# Patient Record
Sex: Male | Born: 1955 | Race: White | Hispanic: No | Marital: Married | Smoking: Former smoker
Health system: Southern US, Community
[De-identification: ages and names within clinical notes are randomized; demographics above are authoritative.]

## PROBLEM LIST (undated history)

## (undated) DIAGNOSIS — T7840XA Allergy, unspecified, initial encounter: Secondary | ICD-10-CM

## (undated) DIAGNOSIS — K219 Gastro-esophageal reflux disease without esophagitis: Secondary | ICD-10-CM

## (undated) HISTORY — DX: Allergy, unspecified, initial encounter: T78.40XA

## (undated) HISTORY — DX: Gastro-esophageal reflux disease without esophagitis: K21.9

## (undated) HISTORY — PX: COLONOSCOPY: SHX174

---

## 2008-02-21 ENCOUNTER — Emergency Department (HOSPITAL_COMMUNITY): Admission: EM | Admit: 2008-02-21 | Discharge: 2008-02-21 | Payer: Self-pay | Admitting: Emergency Medicine

## 2009-03-07 ENCOUNTER — Encounter: Payer: Self-pay | Admitting: Internal Medicine

## 2009-03-29 ENCOUNTER — Ambulatory Visit: Payer: Self-pay | Admitting: Internal Medicine

## 2009-03-29 DIAGNOSIS — R05 Cough: Secondary | ICD-10-CM | POA: Insufficient documentation

## 2009-03-29 DIAGNOSIS — R053 Chronic cough: Secondary | ICD-10-CM | POA: Insufficient documentation

## 2009-03-29 DIAGNOSIS — R930 Abnormal findings on diagnostic imaging of skull and head, not elsewhere classified: Secondary | ICD-10-CM | POA: Insufficient documentation

## 2009-04-27 ENCOUNTER — Ambulatory Visit: Payer: Self-pay | Admitting: Internal Medicine

## 2009-05-15 ENCOUNTER — Telehealth: Payer: Self-pay | Admitting: Internal Medicine

## 2009-05-21 ENCOUNTER — Ambulatory Visit: Payer: Self-pay | Admitting: Internal Medicine

## 2012-12-13 ENCOUNTER — Encounter (INDEPENDENT_AMBULATORY_CARE_PROVIDER_SITE_OTHER): Payer: Self-pay

## 2012-12-13 ENCOUNTER — Encounter (INDEPENDENT_AMBULATORY_CARE_PROVIDER_SITE_OTHER): Payer: Self-pay | Admitting: *Deleted

## 2016-10-20 ENCOUNTER — Encounter: Payer: Self-pay | Admitting: Podiatry

## 2016-10-20 ENCOUNTER — Ambulatory Visit (INDEPENDENT_AMBULATORY_CARE_PROVIDER_SITE_OTHER): Payer: BLUE CROSS/BLUE SHIELD

## 2016-10-20 ENCOUNTER — Ambulatory Visit (INDEPENDENT_AMBULATORY_CARE_PROVIDER_SITE_OTHER): Payer: BLUE CROSS/BLUE SHIELD | Admitting: Podiatry

## 2016-10-20 VITALS — BP 116/65 | HR 60 | Resp 16 | Ht 71.0 in | Wt 198.0 lb

## 2016-10-20 DIAGNOSIS — L84 Corns and callosities: Secondary | ICD-10-CM

## 2016-10-20 DIAGNOSIS — D169 Benign neoplasm of bone and articular cartilage, unspecified: Secondary | ICD-10-CM

## 2016-10-20 NOTE — Progress Notes (Signed)
   Subjective:    Patient ID: David Hopkins, male    DOB: Dec 05, 1955, 61 y.o.   MRN: [id number]  HPI Chief Complaint  Patient presents with  . Callouses    Left foot; 5th toe-medial side; x1+ yr      Review of Systems  HENT: Positive for tinnitus.   All other systems reviewed and are negative.      Objective:   Physical Exam        Assessment & Plan:

## 2016-10-20 NOTE — Progress Notes (Signed)
Subjective:     Patient ID: David Hopkins, male   DOB: October 02, 1955, 61 y.o.   MRN: [id number]  HPI patient presents with wife with painful lesion on the inside of the fifth toe left that he states is very tender and hurts and he's tried to pad it and trim it and it's been going on for over a year   Review of Systems  All other systems reviewed and are negative.      Objective:   Physical Exam  Constitutional: He is oriented to person, place, and time.  Cardiovascular: Intact distal pulses.   Musculoskeletal: Normal range of motion.  Neurological: He is oriented to person, place, and time.  Skin: Skin is warm.  Nursing note and vitals reviewed.  neurovascular status intact muscle strength found to be within normal limits with patient found to have distal medial keratotic lesion digit 5 left that's very painful when pressed and makes wearing shoe gear difficult. 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 I debrided today and padded and scheduled him for exostectomy and I allowed him to read a consent form reviewing alternative treatments complications. Patient wants surgery signed consent form understanding risk and is scheduled for office surgery to remove the spur fifth digit left foot  X-ray indicates rotation fifth digit left with osteochondral lesion formation distal medial aspect fifth digit left

## 2016-10-27 ENCOUNTER — Encounter: Payer: Self-pay | Admitting: Podiatry

## 2016-10-27 ENCOUNTER — Ambulatory Visit (INDEPENDENT_AMBULATORY_CARE_PROVIDER_SITE_OTHER): Payer: BLUE CROSS/BLUE SHIELD | Admitting: Podiatry

## 2016-10-27 VITALS — BP 119/66 | HR 63 | Temp 96.3°F | Resp 16

## 2016-10-27 DIAGNOSIS — D169 Benign neoplasm of bone and articular cartilage, unspecified: Secondary | ICD-10-CM | POA: Diagnosis not present

## 2016-10-27 NOTE — Progress Notes (Signed)
Subjective:     Patient ID: David Hopkins, male   DOB: [birth year], 61 y.o.   MRN: [id number]  HPI patient presents stating he is ready to get this spur on his toe fixed   Review of Systems     Objective:   Physical Exam Neurovascular status intact negative Homans sign was noted with patient's fifth digit left showing keratotic lesion distal medial aspect of the toe with quite a bit of pain present    Assessment:     Exostosis fifth digit left with pain    Plan:     Patient was anesthetized with 100 mg Xylocaine Marcaine mixture and the patient's left foot was prepped and draped utilizing standard aseptic technique. The left foot was exsanguinated and the tourniquet was inflated to 250 mmHg and the following procedure was performed. Attention was directed to the distal medial aspect of the fifth digit left where a some elliptical incision was made over the offending lesion. The incision was deepened down to bone in the intervening skin wedge was noted toe toe. Utilizing a power bur the exostosis was resected flush with all surfaces and a bone paste was created which was excised. The wound was flushed with copious posterior margin solution and sutured with 5-0 nylon and sterile dressing applied with tourniquet released and Refill noted to be immediate to all digits. The patient was taken out of the OR in satisfactory condition

## 2016-10-30 ENCOUNTER — Telehealth: Payer: Self-pay | Admitting: *Deleted

## 2016-10-30 NOTE — Telephone Encounter (Addendum)
Pt's wife, Curly [place] states she is calling to be reminded of when pt can take his dressing off his toe after the surgical procedure Monday. Left message to call again for instructions. Pt states he and his wife had stepped out and missed my call. I informed pt Dr.Regal had stated that he could remove the surgical dressing and get the area shower wet, no standing water, pat area dry and cover with a dry bandaid. Pt states understanding,

## 2016-11-10 ENCOUNTER — Ambulatory Visit (INDEPENDENT_AMBULATORY_CARE_PROVIDER_SITE_OTHER): Payer: Self-pay | Admitting: Podiatry

## 2016-11-10 ENCOUNTER — Ambulatory Visit (INDEPENDENT_AMBULATORY_CARE_PROVIDER_SITE_OTHER): Payer: BLUE CROSS/BLUE SHIELD

## 2016-11-10 ENCOUNTER — Encounter: Payer: Self-pay | Admitting: Podiatry

## 2016-11-10 VITALS — Temp 97.1°F

## 2016-11-10 DIAGNOSIS — Z9889 Other specified postprocedural states: Secondary | ICD-10-CM | POA: Diagnosis not present

## 2016-11-10 DIAGNOSIS — D169 Benign neoplasm of bone and articular cartilage, unspecified: Secondary | ICD-10-CM

## 2016-11-10 NOTE — Progress Notes (Signed)
Subjective:    Patient ID: David Hopkins, male   DOB: 61 y.o.   MRN: [id number]   HPI patient states she's doing very well with his surgery    ROS      Objective:  Physical Exam Neurovascular status intact with significant diminishment of discomfort left fifth digit with wound edges well coapted stitches intact    Assessment:     Doing well post exostectomy fifth digit left    Plan:     Stitches removed wound edges coapted well applied sterile dressing advised on wider shoes. Reappoint to recheck and reviewed x-rays  X-rays reveal satisfactory section of bone

## 2016-12-12 NOTE — Progress Notes (Signed)
Office Surgery DOS 04.16.2018 Removal bone spur 5th toe left.

## 2019-01-18 ENCOUNTER — Encounter: Payer: Self-pay | Admitting: Gastroenterology

## 2019-02-09 ENCOUNTER — Ambulatory Visit (AMBULATORY_SURGERY_CENTER): Payer: Self-pay

## 2019-02-09 ENCOUNTER — Other Ambulatory Visit: Payer: Self-pay

## 2019-02-09 VITALS — Ht 71.0 in | Wt 186.0 lb

## 2019-02-09 DIAGNOSIS — Z1211 Encounter for screening for malignant neoplasm of colon: Secondary | ICD-10-CM

## 2019-02-09 MED ORDER — SUPREP BOWEL PREP KIT 17.5-3.13-1.6 GM/177ML PO SOLN: 1.0000 | Freq: Once | ORAL | 0 refills | Status: AC

## 2019-02-09 NOTE — Progress Notes (Signed)
Per pt, no allergies to soy or egg products.Pt not taking any weight loss meds or using  O2 at home. Pt denies sedation problems  Pt refused emmi video.  The PV was done over the phone due to COVID-19. Verified pt's address and insurance. Reviewed prep instructions and medical hx and will mail paperwork to pt. He will call with any questions or changes prior to his procedure.

## 2019-02-17 ENCOUNTER — Telehealth: Payer: Self-pay | Admitting: Gastroenterology

## 2019-02-17 NOTE — Telephone Encounter (Signed)
Colon WED 8-12  Wife is calling for the pt-   Pt has NOT been exposed to the COVID virus per the pt's wife- he mowed grass last week and now has congestion and this is due to his allergies- he has no fever- he has some cough but he " coughs all the time " due to his allergies per the wife but no change in the cough - He has  Chronic Rhinitis per his allergist -  This is not anything new for him, she asked if he could take the Mucinex DM  Instructed Wife that if he has any NEW signs/symptoms like fever, change in cough, loss of taste or smell, shortness of breath, they will need to call back-  If this is just the same allergy issues he is good to have the colon WED - ok as well to take the Mucinex as directed by allergist   Wife states she will call back if any changes   Lelan Pons PV

## 2019-02-17 NOTE — Telephone Encounter (Signed)
Pt reported that he has allergies and drainage and is taking Mucinex DM.  He would like to know whether this would interfere with colonoscopy.

## 2019-02-22 ENCOUNTER — Telehealth: Payer: Self-pay | Admitting: Gastroenterology

## 2019-02-22 NOTE — Telephone Encounter (Signed)

## 2019-02-23 ENCOUNTER — Other Ambulatory Visit: Payer: Self-pay

## 2019-02-23 ENCOUNTER — Ambulatory Visit (AMBULATORY_SURGERY_CENTER): Payer: BC Managed Care – PPO | Admitting: Gastroenterology

## 2019-02-23 ENCOUNTER — Encounter: Payer: Self-pay | Admitting: Gastroenterology

## 2019-02-23 VITALS — BP 102/61 | HR 62 | Temp 98.4°F | Resp 11 | Ht 71.0 in | Wt 186.0 lb

## 2019-02-23 DIAGNOSIS — Z1211 Encounter for screening for malignant neoplasm of colon: Secondary | ICD-10-CM | POA: Diagnosis present

## 2019-02-23 DIAGNOSIS — D123 Benign neoplasm of transverse colon: Secondary | ICD-10-CM

## 2019-02-23 MED ORDER — SODIUM CHLORIDE 0.9 % IV SOLN: 500.0000 mL | Freq: Once | INTRAVENOUS | Status: DC

## 2019-02-23 NOTE — Progress Notes (Signed)
Report given to PACU, vss 

## 2019-02-23 NOTE — Progress Notes (Signed)
Pt's states no medical or surgical changes since previsit or office visit.  JB temps and CW vitals. 

## 2019-02-23 NOTE — Op Note (Signed)
[facility] Patient Name: David Hopkins Procedure Date: 02/23/2019 8:19 AM MRN: [id number] Endoscopist: Mallie Mussel L. Loletha Carrow , MD Age: 63 Referring MD:  Date of Birth: 15-Oct-1955 Gender: Male Account #: [id number] Procedure:                Colonoscopy Indications:              Screening for colorectal malignant neoplasm                            (patient reported normal colonoscopy > 10 years                            prior) Medicines:                Monitored Anesthesia Care Procedure:                Pre-Anesthesia Assessment:                           - Prior to the procedure, a History and Physical                            was performed, and patient medications and                            allergies were reviewed. The patient's tolerance of                            previous anesthesia was also reviewed. The risks                            and benefits of the procedure and the sedation                            options and risks were discussed with the patient.                            All questions were answered, and informed consent                            was obtained. Prior Anticoagulants: The patient has                            taken no previous anticoagulant or antiplatelet                            agents. ASA Grade Assessment: II - A patient with                            mild systemic disease. After reviewing the risks                            and benefits, the patient was deemed in  satisfactory condition to undergo the procedure.                           After obtaining informed consent, the colonoscope                            was passed under direct vision. Throughout the                            procedure, the patient's blood pressure, pulse, and                            oxygen saturations were monitored continuously. The                            Colonoscope was introduced through the anus and              advanced to the the cecum, identified by                            appendiceal orifice and ileocecal valve. The                            colonoscopy was performed without difficulty. The                            patient tolerated the procedure well. The quality                            of the bowel preparation was good. The ileocecal                            valve, appendiceal orifice, and rectum were                            photographed. Scope In: 8:39:26 AM Scope Out: 8:51:33 AM Scope Withdrawal Time: 0 hours 9 minutes 51 seconds  Total Procedure Duration: 0 hours 12 minutes 7 seconds  Findings:                 The perianal and digital rectal examinations were                            normal.                           Multiple diverticula were found in the left colon                            and right colon.                           A 8 mm polyp was found in the transverse colon. The                            polyp was sessile.  The polyp was removed with a                            cold snare. Resection and retrieval were complete.                           The exam was otherwise without abnormality on                            direct and retroflexion views. Complications:            No immediate complications. Estimated Blood Loss:     Estimated blood loss was minimal. Impression:               - Diverticulosis in the left colon and in the right                            colon.                           - One 8 mm polyp in the transverse colon, removed                            with a cold snare. Resected and retrieved.                           - The examination was otherwise normal on direct                            and retroflexion views. Recommendation:           - Patient has a contact number available for                            emergencies. The signs and symptoms of potential                            delayed complications were discussed  with the                            patient. Return to normal activities tomorrow.                            Written discharge instructions were provided to the                            patient.                           - Resume previous diet.                           - Continue present medications.                           - Await pathology results.                           -  Repeat colonoscopy is recommended for                            surveillance. The colonoscopy date will be                            determined after pathology results from today's                            exam become available for review. Thereasa Iannello L. Loletha Carrow, MD 02/23/2019 8:57:24 AM This report has been signed electronically.

## 2019-02-23 NOTE — Progress Notes (Signed)
Called to room to assist during endoscopic procedure.  Patient ID and intended procedure confirmed with present staff. Received instructions for my participation in the procedure from the performing physician.  

## 2019-02-23 NOTE — Patient Instructions (Signed)
Ead all of the handouts given to you by your recovery  room nurse.  Thank-you for choosing [place] for your healthcare needs today.  YOU HAD AN ENDOSCOPIC PROCEDURE TODAY AT [facility]:   Refer to the procedure report that was given to you for any specific questions about what was found during the examination.  If the procedure report does not answer your questions, please call your gastroenterologist to clarify.  If you requested that your care partner not be given the details of your procedure findings, then the procedure report has been included in a sealed envelope for you to review at your convenience later.  YOU SHOULD EXPECT: Some feelings of bloating in the abdomen. Passage of more gas than usual.  Walking can help get rid of the air that was put into your GI tract during the procedure and reduce the bloating. If you had a lower endoscopy (such as a colonoscopy or flexible sigmoidoscopy) you may notice spotting of blood in your stool or on the toilet paper. If you underwent a bowel prep for your procedure, you may not have a normal bowel movement for a few days.  Please Note:  You might notice some irritation and congestion in your nose or some drainage.  This is from the oxygen used during your procedure.  There is no need for concern and it should clear up in a day or so.  SYMPTOMS TO REPORT IMMEDIATELY:   Following lower endoscopy (colonoscopy or flexible sigmoidoscopy):  Excessive amounts of blood in the stool  Significant tenderness or worsening of abdominal pains  Swelling of the abdomen that is new, acute  Fever of 100F or higher  For urgent or emergent issues, a gastroenterologist can be reached at any hour by calling [phone].   DIET:  We do recommend a small meal at first, but then you may proceed to your regular diet.  Drink plenty of fluids but you should avoid alcoholic beverages for 24 hours. Try to increase the fiber in your diet, and drink plenty of  water. ACTIVITY:  You should plan to take it easy for the rest of today and you should NOT DRIVE or use heavy machinery until tomorrow (because of the sedation medicines used during the test).    FOLLOW UP: Our staff will call the number listed on your records 48-72 hours following your procedure to check on you and address any questions or concerns that you may have regarding the information given to you following your procedure. If we do not reach you, we will leave a message.  We will attempt to reach you two times.  During this call, we will ask if you have developed any symptoms of COVID 19. If you develop any symptoms (ie: fever, flu-like symptoms, shortness of breath, cough etc.) before then, please call [phone].  If you test positive for Covid 19 in the 2 weeks post procedure, please call and report this information to [place].    If any biopsies were taken you will be contacted by phone or by letter within the next 1-3 weeks.  Please call us at [phone] if you have not heard about the biopsies in 3 weeks.    SIGNATURES/CONFIDENTIALITY: You and/or your care partner have signed paperwork which will be entered into your electronic medical record.  These signatures attest to the fact that that the information above on your After Visit Summary has been reviewed and is understood.  Full responsibility of the confidentiality of  this discharge information lies with you and/or your care-partner.

## 2019-02-25 ENCOUNTER — Telehealth: Payer: Self-pay | Admitting: *Deleted

## 2019-02-25 ENCOUNTER — Encounter: Payer: Self-pay | Admitting: Gastroenterology

## 2019-02-25 NOTE — Telephone Encounter (Signed)
  Follow up Call-  Call back number 02/23/2019  Post procedure Call Back phone  # [phone]  Permission to leave phone message Yes  Some recent data might be hidden     Patient questions:  Do you have a fever, pain , or abdominal swelling? No. Pain Score  0 *  Have you tolerated food without any problems? Yes.    Have you been able to return to your normal activities? Yes.    Do you have any questions about your discharge instructions: Diet   No. Medications  No. Follow up visit  No.  Do you have questions or concerns about your Care? No.  Actions: * If pain score is 4 or above: No action needed, pain <4.  1. Have you developed a fever since your procedure? no  2.   Have you had an respiratory symptoms (SOB or cough) since your procedure? no  3.   Have you tested positive for COVID 19 since your procedure no  4.   Have you had any family members/close contacts diagnosed with the COVID 19 since your procedure?  no   If yes to any of these questions please route to Joylene John, RN and Alphonsa Gin, Therapist, sports.

## 2019-07-15 HISTORY — PX: ROTATOR CUFF REPAIR: SHX139

## 2019-12-31 ENCOUNTER — Emergency Department (HOSPITAL_COMMUNITY)
Admission: EM | Admit: 2019-12-31 | Discharge: 2019-12-31 | Disposition: A | Discharge status: Home / Self Care | Payer: Medicare HMO | Attending: Emergency Medicine | Admitting: Emergency Medicine

## 2019-12-31 ENCOUNTER — Encounter (HOSPITAL_COMMUNITY): Payer: Self-pay | Admitting: Emergency Medicine

## 2019-12-31 ENCOUNTER — Other Ambulatory Visit: Payer: Self-pay

## 2019-12-31 ENCOUNTER — Emergency Department (HOSPITAL_COMMUNITY): Payer: Medicare HMO

## 2019-12-31 DIAGNOSIS — M791 Myalgia, unspecified site: Secondary | ICD-10-CM | POA: Insufficient documentation

## 2019-12-31 DIAGNOSIS — R0602 Shortness of breath: Secondary | ICD-10-CM | POA: Diagnosis not present

## 2019-12-31 DIAGNOSIS — R0789 Other chest pain: Secondary | ICD-10-CM

## 2019-12-31 NOTE — ED Provider Notes (Signed)
[facility] Provider Note   CSN: [id number] Arrival date & time: 12/31/19  1151     History Chief Complaint  Patient presents with  . Chest Pain    David Hopkins is a 64 y.o. male presenting to ED with left chest wall pain.  He tripped 2 days ago and fell onto his left side onto his elbow, driving it into his chest.  He has pointed rib tenderness there. Worse with deep inspiration.  HPI     Past Medical History:  Diagnosis Date  . Allergy   . GERD (gastroesophageal reflux disease)     Patient Active Problem List   Diagnosis Date Noted  . COUGH 03/29/2009  . Nonspecific (abnormal) findings on radiological and other examination of body structure 03/29/2009  . ABNORMAL LUNG XRAY 03/29/2009    Past Surgical History:  Procedure Laterality Date  . COLONOSCOPY         Family History  Problem Relation Age of Onset  . Healthy Mother   . Other Father   . Healthy Sister   . Healthy Brother   . Healthy Sister   . Healthy Sister   . Colon cancer Neg Hx   . Rectal cancer Neg Hx     Social History   Tobacco Use  . Smoking status: Former Research scientist (life sciences)  . Smokeless tobacco: Never Used  . Tobacco comment: over 40 years ago  Substance Use Topics  . Alcohol use: Yes    Comment: occasional  . Drug use: Never    Home Medications Prior to Admission medications   Medication Sig Start Date End Date Taking? Authorizing Provider  Ascorbic Acid (VITAMIN C) 1000 MG tablet Take 1,000 mg by mouth 2 (two) times daily. Take 2 pills twice a day    [provider]  [facility] 30 METERED DOSES 220 MCG/INH inhaler  09/26/16   [provider]  levocetirizine (XYZAL) 5 MG tablet Take 5 mg by mouth every evening.    [provider]  omeprazole (PRILOSEC) 20 MG capsule Take 20 mg by mouth 2 (two) times daily before a meal.  09/01/16   [provider]  OVER THE COUNTER MEDICATION Super beta prostate pill- Take one pill twice a day    [provider]  pyridOXINE (VITAMIN B-6) 100 MG tablet Take 100 mg by mouth daily.    [provider]  vitamin B-12 (CYANOCOBALAMIN) 1000 MCG tablet Take 1,000 mcg by mouth daily.    [provider]    Allergies    Mold extract [trichophyton]  Review of Systems   Review of Systems  Constitutional: Negative for chills and fever.  Respiratory: Positive for shortness of breath. Negative for cough.   Cardiovascular: Positive for chest pain. Negative for palpitations.  Gastrointestinal: Negative for abdominal pain and vomiting.  Musculoskeletal: Positive for arthralgias and myalgias.  Skin: Negative for color change and rash.  Neurological: Negative for syncope and headaches.  Psychiatric/Behavioral: Negative for agitation and confusion.  All other systems reviewed and are negative.   Physical Exam Updated Vital Signs BP 124/78 (BP Location: Right Arm)   Pulse 63   Temp 98.1 F (36.7 C) (Oral)   Resp 18   Ht 5\' 10"  (1.778 m)   Wt 89.9 kg   SpO2 99%   BMI 28.45 kg/m   Physical Exam Vitals and nursing note reviewed.  Constitutional:      Appearance: He is well-developed.  HENT:     Head: Normocephalic and atraumatic.  Eyes:     Conjunctiva/sclera: Conjunctivae normal.  Cardiovascular:     Rate and Rhythm: Normal rate and regular rhythm.     Heart sounds: No murmur heard.      Comments: Left chest wall anterolateral focal ribline tenderness Pulmonary:     Effort: Pulmonary effort is normal. No respiratory distress.     Breath sounds: Normal breath sounds.  Abdominal:     Palpations: Abdomen is soft.     Tenderness: There is no abdominal tenderness.  Musculoskeletal:     Cervical back: Neck supple.  Skin:    General: Skin is warm and dry.  Neurological:     Mental Status: He is alert.  Psychiatric:        Mood and Affect: Mood normal.        Behavior: Behavior normal.     ED Results / Procedures / Treatments   Labs (all labs ordered are  listed, but only abnormal results are displayed) Labs Reviewed - No data to display  EKG None  Radiology DG Ribs Unilateral W/Chest Left  Result Date: 12/31/2019 CLINICAL DATA:  64 year old male with a history of fall and rib pain EXAM: LEFT RIBS AND CHEST - 3+ VIEW COMPARISON:  None. FINDINGS: No fracture or other bone lesions are seen involving the ribs. There is no evidence of pneumothorax or pleural effusion. Both lungs are clear. Heart size and mediastinal contours are within normal limits. IMPRESSION: Negative. Electronically Signed   By: Corrie Mckusick D.O.   On: 12/31/2019 13:14    Procedures Procedures (including critical care time)  Medications Ordered in ED Medications - No data to display  ED Course  I have reviewed the triage vital signs and the nursing notes.  Pertinent labs & imaging results that were available during my care of the patient were reviewed by me and considered in my medical decision making (see chart for details).  64 yo male presenting with left anterolateral rib pain after a mechanical fall 2 days ago onto his side.  Patient appears comfortable.  No PTX on xray or per clinical exam.  Cannot visualize discrete fx on xray but clinically his presentation is highly consistent with this, and the pain is focally reproducible.  Doubtful of ACS or PE or PNA.   He will continue OTC pain meds. Okay for discharge.     Final Clinical Impression(s) / ED Diagnoses Final diagnoses:  Chest wall pain    Rx / DC Orders ED Discharge Orders    None       Aeneas Longsworth, Carola Rhine, MD 12/31/19 1714

## 2019-12-31 NOTE — ED Triage Notes (Signed)
Chasing a runaway tractor [place] L rib pain   Yesterday felt pop  Now with pain to L ribs

## 2019-12-31 NOTE — Discharge Instructions (Addendum)
Your x-rays did not show any obvious rib fractures.  However, as we discussed, there is still a strong possibility you have a small fracture we did not see.  I strongly suspect this is the case.  At home you can continue taking ibuprofen and Tylenol alternating every 3 hours as needed for pain.  This can take 4 to 6 weeks to heal.  Continue to do breathing exercises at home.  Please follow-up with your primary care doctor.

## 2021-08-03 IMAGING — DX DG RIBS W/ CHEST 3+V*L*
5 series · 5 of 5 positions shown · non-contrast
Comparison: None.

CLINICAL DATA: 64-year-old male with a history of fall and rib pain

EXAM:
LEFT RIBS AND CHEST - 3+ VIEW

[chest pa]
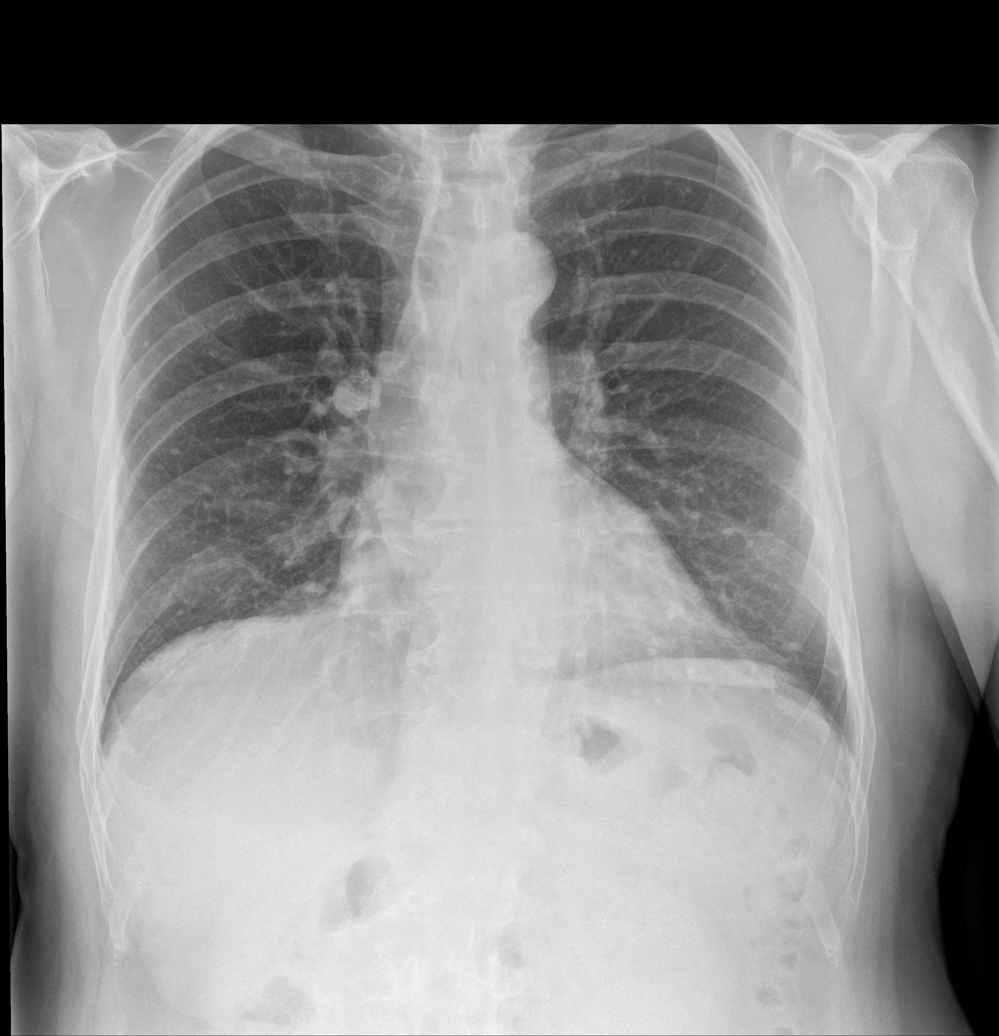

[rib pa obl (1 of 2)]
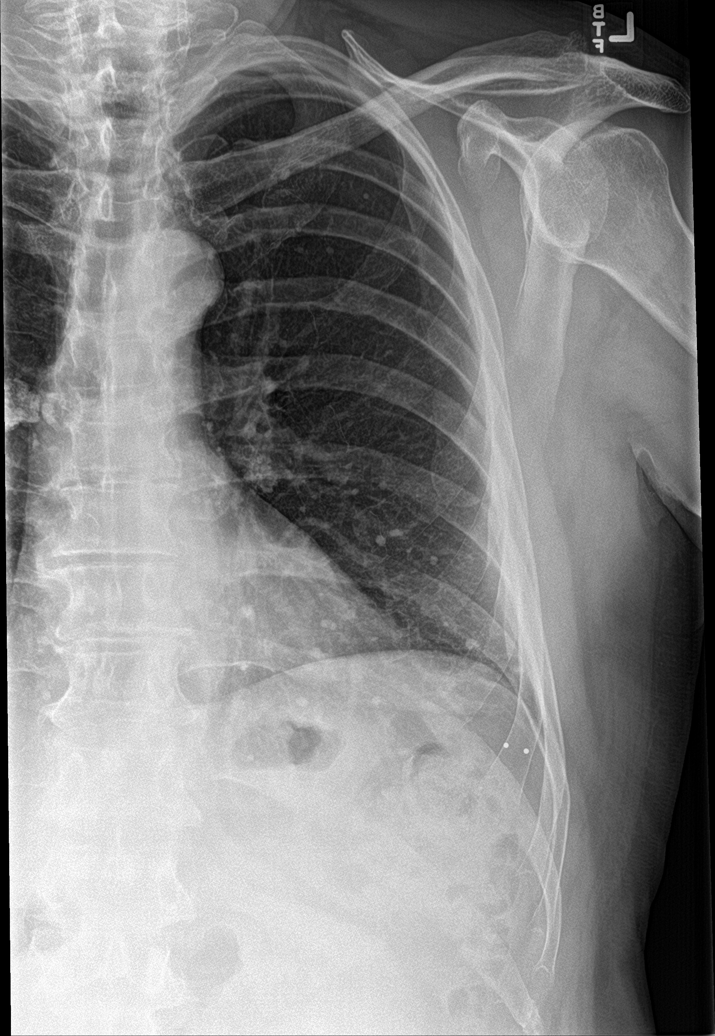

[rib pa obl (2 of 2)]
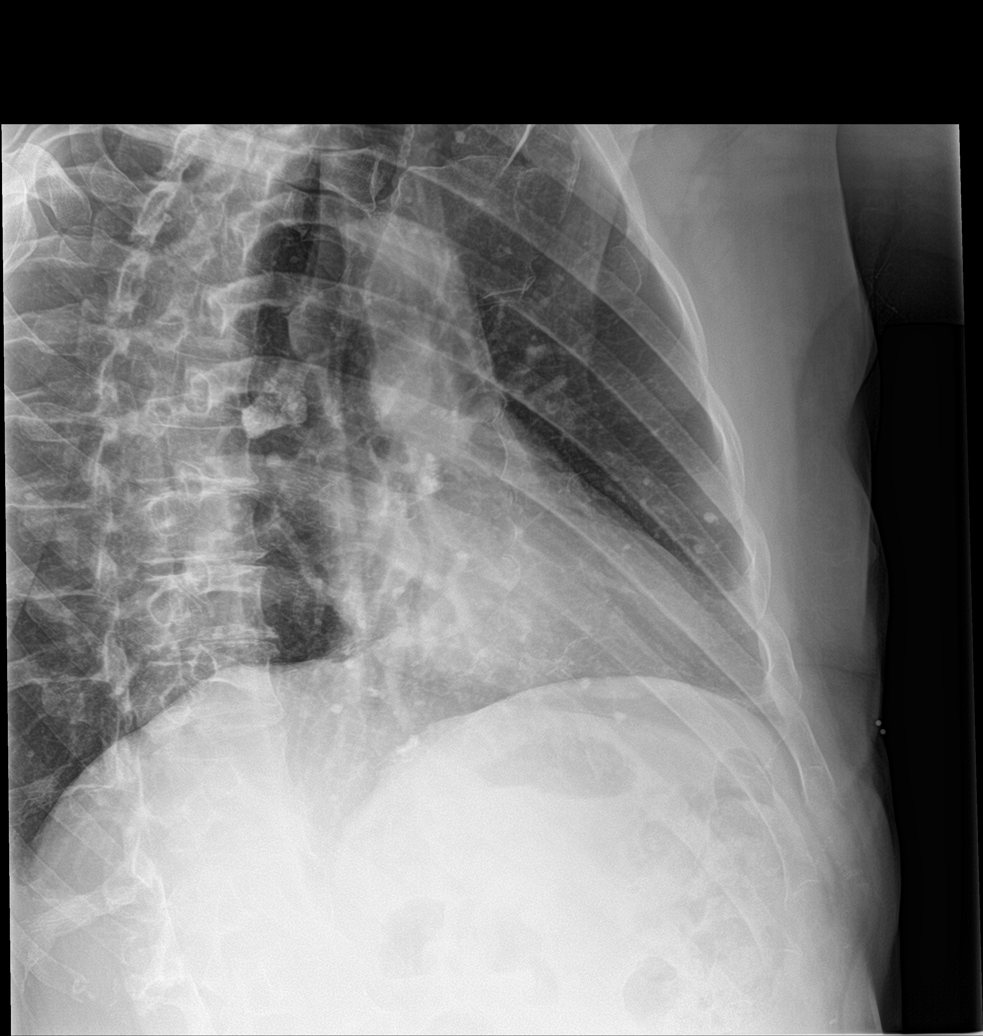

[rib pa (1 of 2)]
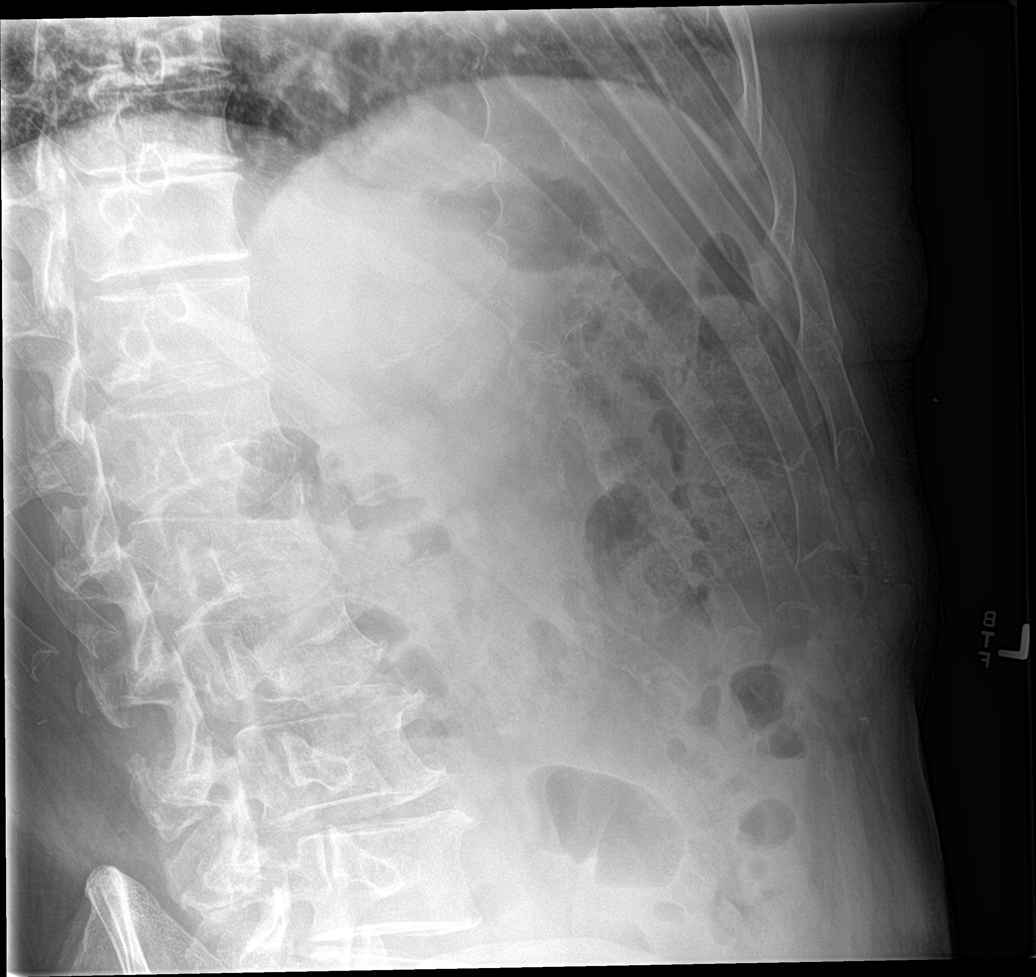

[rib pa (2 of 2)]
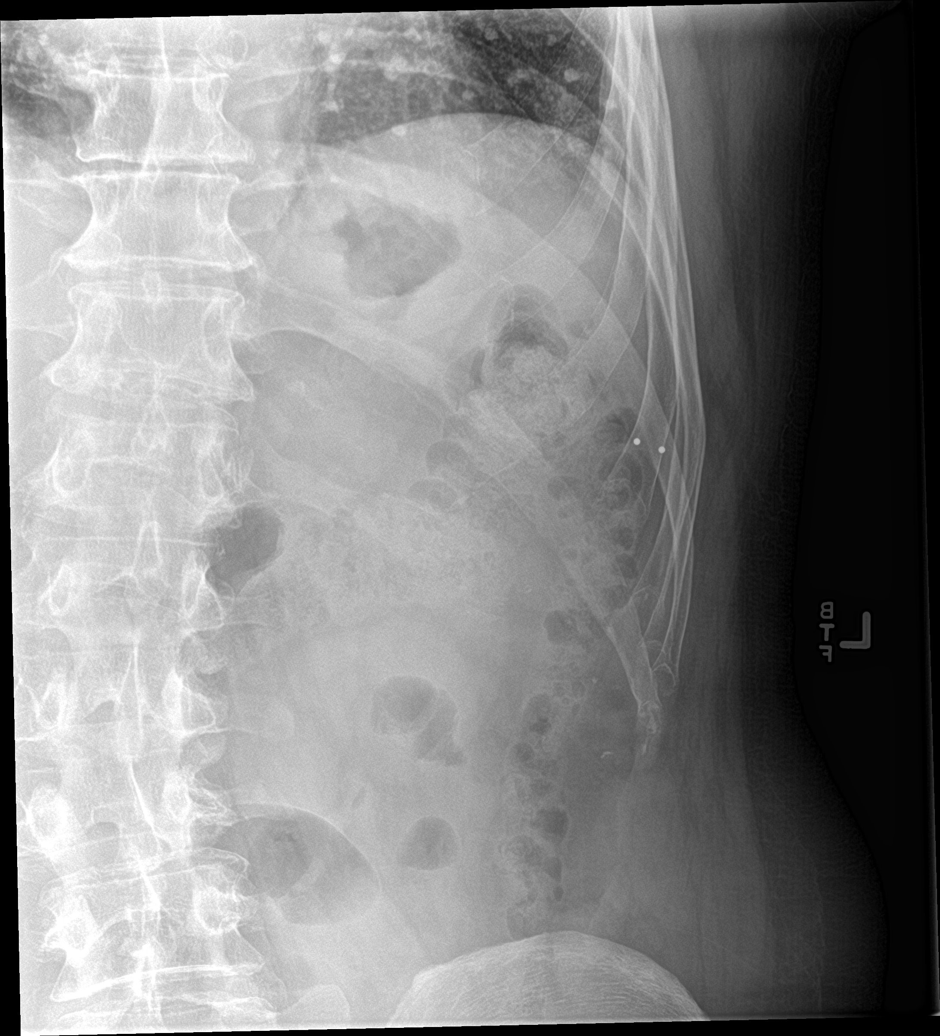

[5 of 5 positions shown; findings below may reference images not displayed]

FINDINGS: No fracture or other bone lesions are seen involving the ribs. There
is no evidence of pneumothorax or pleural effusion. Both lungs are
clear. Heart size and mediastinal contours are within normal limits.
IMPRESSION: Negative.

## 2021-12-30 ENCOUNTER — Encounter: Payer: Self-pay | Admitting: Gastroenterology

## 2022-02-12 ENCOUNTER — Ambulatory Visit (AMBULATORY_SURGERY_CENTER): Payer: Self-pay

## 2022-02-12 VITALS — Ht 70.0 in | Wt 195.0 lb

## 2022-02-12 DIAGNOSIS — Z86010 Personal history of colonic polyps: Secondary | ICD-10-CM

## 2022-02-12 MED ORDER — NA SULFATE-K SULFATE-MG SULF 17.5-3.13-1.6 GM/177ML PO SOLN: 1.0000 | Freq: Once | ORAL | 0 refills | Status: AC

## 2022-02-12 MED ORDER — NA SULFATE-K SULFATE-MG SULF 17.5-3.13-1.6 GM/177ML PO SOLN: 1.0000 | Freq: Once | ORAL | 0 refills | Status: DC

## 2022-02-12 MED ORDER — NA SULFATE-K SULFATE-MG SULF 17.5-3.13-1.6 GM/177ML PO SOLN: 1.0000 | ORAL | 0 refills | Status: DC

## 2022-02-12 NOTE — Progress Notes (Signed)
No egg or soy allergy known to patient  No issues known to pt with past sedation with any surgeries or procedures Patient denies ever being told they had issues or difficulty with intubation  No FH of Malignant Hyperthermia Pt is not on diet pills Pt is not on  home 02  Pt is not on blood thinners  Pt denies issues with constipation  No A fib or A flutter Have any cardiac testing pending--denied Pt instructed to use [email or web address] or GoodRx for a price reduction on prep   

## 2022-03-05 ENCOUNTER — Ambulatory Visit (AMBULATORY_SURGERY_CENTER): Payer: Medicare HMO | Admitting: Gastroenterology

## 2022-03-05 ENCOUNTER — Encounter: Payer: Self-pay | Admitting: Gastroenterology

## 2022-03-05 VITALS — BP 112/64 | HR 68 | Temp 98.2°F | Resp 14 | Ht 70.0 in | Wt 195.0 lb

## 2022-03-05 DIAGNOSIS — D122 Benign neoplasm of ascending colon: Secondary | ICD-10-CM

## 2022-03-05 DIAGNOSIS — Z09 Encounter for follow-up examination after completed treatment for conditions other than malignant neoplasm: Secondary | ICD-10-CM

## 2022-03-05 DIAGNOSIS — Z86010 Personal history of colonic polyps: Secondary | ICD-10-CM | POA: Diagnosis not present

## 2022-03-05 MED ORDER — SODIUM CHLORIDE 0.9 % IV SOLN: 500.0000 mL | Freq: Once | INTRAVENOUS | Status: DC

## 2022-03-05 NOTE — Progress Notes (Signed)
History and Physical:  This patient presents for endoscopic testing for: Encounter Diagnosis  Name Primary?   Personal history of colonic polyps Yes    66 year old man here today for a surveillance colonoscopy.  An 8 to 10 mm tubular adenoma was removed during his last colonoscopy August 2020.  No polyps were found on a colonoscopy 10 years prior to that. Patient denies chronic abdominal pain, rectal bleeding, constipation or diarrhea.  Patient is otherwise without complaints or active issues today.   Past Medical History: Past Medical History:  Diagnosis Date   Allergy    GERD (gastroesophageal reflux disease)      Past Surgical History: Past Surgical History:  Procedure Laterality Date   COLONOSCOPY     ROTATOR CUFF REPAIR Left 2021    Allergies: Allergies  Allergen Reactions   Mold Extract [Trichophyton] Cough    Outpatient Meds: Current Outpatient Medications  Medication Sig Dispense Refill   Ascorbic Acid (VITAMIN C) 1000 MG tablet Take 1,000 mg by mouth 2 (two) times daily. Take 2 pills twice a day     omeprazole (PRILOSEC) 20 MG capsule Take 20 mg by mouth 2 (two) times daily before a meal.      OVER THE COUNTER MEDICATION Super beta prostate pill- Take one pill twice a day     pyridOXINE (VITAMIN B-6) 100 MG tablet Take 100 mg by mouth daily.     Vitamin D, Ergocalciferol, 50000 units CAPS Take by mouth.     Current Facility-Administered Medications  Medication Dose Route Frequency Provider Last Rate Last Admin   0.9 %  sodium chloride infusion  500 mL Intravenous Once Danis, Kirke Corin, MD          ___________________________________________________________________ Objective   Exam:  BP 113/72   Pulse (!) 55   Temp 98.2 F (36.8 C) (Skin)   Ht '5\' 10"'$  (1.778 m)   Wt 195 lb (88.5 kg)   SpO2 99%   BMI 27.98 kg/m   CV: RRR without murmur, S1/S2 Resp: clear to auscultation bilaterally, normal RR and effort noted GI: soft, no tenderness, with  active bowel sounds.   Assessment: Encounter Diagnosis  Name Primary?   Personal history of colonic polyps Yes     Plan: Colonoscopy  The benefits and risks of the planned procedure were described in detail with the patient or (when appropriate) their health care proxy.  Risks were outlined as including, but not limited to, bleeding, infection, perforation, adverse medication reaction leading to cardiac or pulmonary decompensation, pancreatitis (if ERCP).  The limitation of incomplete mucosal visualization was also discussed.  No guarantees or warranties were given.    The patient is appropriate for an endoscopic procedure in the ambulatory setting.   - Wilfrid Lund, MD

## 2022-03-05 NOTE — Progress Notes (Signed)
VSS, transported to PACU °

## 2022-03-05 NOTE — Op Note (Signed)
[facility] Patient Name: David Hopkins Procedure Date: 03/05/2022 10:08 AM MRN: [id number] Endoscopist: Mallie Mussel L. Loletha Carrow , MD Age: 66 Referring MD:  Date of Birth: [birth year] Gender: Male Account #: [id number] Procedure:                Colonoscopy Indications:              Surveillance: Personal history of adenomatous                            polyps on last colonoscopy 3 years ago                           8-65m TA on last colonoscopy Aug 2020                           no polyps on 2010 colonoscopy Medicines:                Monitored Anesthesia Care Procedure:                Pre-Anesthesia Assessment:                           - Prior to the procedure, a History and Physical                            was performed, and patient medications and                            allergies were reviewed. The patient's tolerance of                            previous anesthesia was also reviewed. The risks                            and benefits of the procedure and the sedation                            options and risks were discussed with the patient.                            All questions were answered, and informed consent                            was obtained. Prior Anticoagulants: The patient has                            taken no previous anticoagulant or antiplatelet                            agents. ASA Grade Assessment: II - A patient with                            mild systemic disease. After reviewing the risks  and benefits, the patient was deemed in                            satisfactory condition to undergo the procedure.                           After obtaining informed consent, the colonoscope                            was passed under direct vision. Throughout the                            procedure, the patient's blood pressure, pulse, and                            oxygen saturations were monitored continuously. The                             Olympus CF-HQ190L ([id number]) Colonoscope was                            introduced through the anus and advanced to the the                            cecum, identified by appendiceal orifice and                            ileocecal valve. The colonoscopy was performed                            without difficulty. The patient tolerated the                            procedure well. The quality of the bowel                            preparation was good. The ileocecal valve,                            appendiceal orifice, and rectum were photographed. Scope In: 10:16:09 AM Scope Out: 10:26:17 AM Scope Withdrawal Time: 0 hours 7 minutes 55 seconds  Total Procedure Duration: 0 hours 10 minutes 8 seconds  Findings:                 The perianal and digital rectal examinations were                            normal.                           A diminutive polyp was found in the ascending                            colon. The polyp was semi-sessile. The polyp was  removed with a cold snare. Resection and retrieval                            were complete.                           Multiple diverticula were found in the left colon                            and right colon.                           Repeat examination of right colon under NBI                            performed.                           Internal hemorrhoids were found.                           The exam was otherwise without abnormality on                            direct and retroflexion views. Complications:            No immediate complications. Estimated Blood Loss:     Estimated blood loss was minimal. Impression:               - One diminutive polyp in the ascending colon,                            removed with a cold snare. Resected and retrieved.                           - Diverticulosis in the left colon and in the right                            colon.                            - Internal hemorrhoids.                           - The examination was otherwise normal on direct                            and retroflexion views. Recommendation:           - Patient has a contact number available for                            emergencies. The signs and symptoms of potential                            delayed complications were discussed with the  patient. Return to normal activities tomorrow.                            Written discharge instructions were provided to the                            patient.                           - Resume previous diet.                           - Continue present medications.                           - Await pathology results.                           - Repeat colonoscopy in 5 years for surveillance. David Hopkins L. Loletha Carrow, MD 03/05/2022 10:32:01 AM This report has been signed electronically.

## 2022-03-05 NOTE — Progress Notes (Signed)
VS by [place]  Pt's states no medical or surgical changes since previsit or office visit.  

## 2022-03-05 NOTE — Progress Notes (Signed)
Called to room to assist during endoscopic procedure.  Patient ID and intended procedure confirmed with present staff. Received instructions for my participation in the procedure from the performing physician.  

## 2022-03-05 NOTE — Patient Instructions (Signed)
Read all of the handouts given to you by your recovery room nurse.  YOU HAD AN ENDOSCOPIC PROCEDURE TODAY AT THE [facility]:   Refer to the procedure report that was given to you for any specific questions about what was found during the examination.  If the procedure report does not answer your questions, please call your gastroenterologist to clarify.  If you requested that your care partner not be given the details of your procedure findings, then the procedure report has been included in a sealed envelope for you to review at your convenience later.  YOU SHOULD EXPECT: Some feelings of bloating in the abdomen. Passage of more gas than usual.  Walking can help get rid of the air that was put into your GI tract during the procedure and reduce the bloating. If you had a lower endoscopy (such as a colonoscopy or flexible sigmoidoscopy) you may notice spotting of blood in your stool or on the toilet paper. If you underwent a bowel prep for your procedure, you may not have a normal bowel movement for a few days.  Please Note:  You might notice some irritation and congestion in your nose or some drainage.  This is from the oxygen used during your procedure.  There is no need for concern and it should clear up in a day or so.  SYMPTOMS TO REPORT IMMEDIATELY:  Following lower endoscopy (colonoscopy or flexible sigmoidoscopy):  Excessive amounts of blood in the stool  Significant tenderness or worsening of abdominal pains  Swelling of the abdomen that is new, acute  Fever of 100F or higher   For urgent or emergent issues, a gastroenterologist can be reached at any hour by calling [phone]. Do not use MyChart messaging for urgent concerns.    DIET:  We do recommend a small meal at first, but then you may proceed to your regular diet.  Drink plenty of fluids but you should avoid alcoholic beverages for 24 hours.  ACTIVITY:  You should plan to take it easy for the rest of today and  you should NOT DRIVE or use heavy machinery until tomorrow (because of the sedation medicines used during the test).    FOLLOW UP: Our staff will call the number listed on your records the next business day following your procedure.  We will call around 7:15- 8:00 am to check on you and address any questions or concerns that you may have regarding the information given to you following your procedure. If we do not reach you, we will leave a message.  If you develop any symptoms (ie: fever, flu-like symptoms, shortness of breath, cough etc.) before then, please call [phone].  If you test positive for Covid 19 in the 2 weeks post procedure, please call and report this information to us.    If any biopsies were taken you will be contacted by phone or by letter within the next 1-3 weeks.  Please call us at [phone] if you have not heard about the biopsies in 3 weeks.    SIGNATURES/CONFIDENTIALITY: You and/or your care partner have signed paperwork which will be entered into your electronic medical record.  These signatures attest to the fact that that the information above on your After Visit Summary has been reviewed and is understood.  Full responsibility of the confidentiality of this discharge information lies with you and/or your care-partner.  

## 2022-03-06 ENCOUNTER — Telehealth: Payer: Self-pay | Admitting: *Deleted

## 2022-03-06 NOTE — Telephone Encounter (Signed)
  Follow up Call-     03/05/2022    9:32 AM  Call back number  Post procedure Call Back phone  # [phone]  Permission to leave phone message Yes     Patient questions:  Do you have a fever, pain , or abdominal swelling? No. Pain Score  0 *  Have you tolerated food without any problems? Yes.    Have you been able to return to your normal activities? Yes.    Do you have any questions about your discharge instructions: Diet   No. Medications  No. Follow up visit  No.  Do you have questions or concerns about your Care? No.  Actions: * If pain score is 4 or above: No action needed, pain <4.

## 2022-03-07 ENCOUNTER — Encounter: Payer: Self-pay | Admitting: Gastroenterology

## 2022-08-28 ENCOUNTER — Encounter (HOSPITAL_BASED_OUTPATIENT_CLINIC_OR_DEPARTMENT_OTHER): Payer: Self-pay | Admitting: Pulmonary Disease

## 2022-08-28 ENCOUNTER — Ambulatory Visit (INDEPENDENT_AMBULATORY_CARE_PROVIDER_SITE_OTHER): Payer: Medicare HMO | Admitting: Pulmonary Disease

## 2022-08-28 VITALS — BP 108/62 | HR 57 | Temp 97.8°F | Ht 69.29 in | Wt 193.6 lb

## 2022-08-28 DIAGNOSIS — R053 Chronic cough: Secondary | ICD-10-CM

## 2022-08-28 MED ORDER — CHLORPHENIRAMINE MALEATE 4 MG PO TABS: ORAL_TABLET | ORAL | 0 refills | Status: AC

## 2022-08-28 NOTE — Assessment & Plan Note (Signed)
This cough has been going on for 11 years.  Allergy and ENT evaluation have been negative.  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  Sarcoidosis could be a possibility. We should obtain a CT chest without contrast to follow-up on that finding.  Will also obtain PFTs to rule out cough-variant asthma. Otherwise we have to consider the usual suspects of upper airway cough syndrome and reflux. I suggested sequential therapeutic trials -we will treat him initially with chlorpheniramine/first generation antihistaminic and decongestant combination for 3 to 4 weeks and see how much better he is with the cough.  He will continue on omeprazole

## 2022-08-28 NOTE — Addendum Note (Signed)
Addended by: Darliss Ridgel on: 08/28/2022 10:07 AM   Modules accepted: Orders

## 2022-08-28 NOTE — Patient Instructions (Signed)
Chronic cough may be related to sinus drip or reflux. You had calcified lymph glands and nodules in your lung which suggests old infection.  x CT chest without contrast in [place]  x schedule PFTs at [facility]  Trial of chlorpheniramine 4 mg at bedtime x 3 weeks + phenylephrine 10 mg   (combination [facility] brand 'sinus PE')

## 2022-08-28 NOTE — Progress Notes (Signed)
Subjective:    Patient ID: David Hopkins, male    DOB: 10/04/1955, 67 y.o.   MRN: [id number]  HPI  Past Medical History:  Diagnosis Date   Allergy    GERD (gastroesophageal reflux disease)    67 year old remote smoker presents for evaluation of cough that has been ongoing for 8 to 10 years. He reports a cough that is worse in the evenings, productive of clear thick mucus.  It is worse with talking, cold drinks or with URIs.  Lying down seems to make it better if he comes down for about 10 minutes. He is accompanied by his wife [place] who corroborates history.  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  He reports nasal drainage.  He has seen at least 2 allergists for this problem, he was tested by Dr. Remus Blake and  told that he does not have allergies.  He is also seen at least 2 ENT physicians in [place] and at [facility] and had normal exam.  He made this appointment himself.  He reports occasional nasal drainage.  He reports reflux symptoms for which he has been taking omeprazole for many years. He had COVID in January and had severe cough to the point of posttussive vomiting.  This improved with albuterol MDI. He has tried homemade remedies including onions with honey and this seemed to help  X-ray ribs 12/2019 clear lung fields 02/2009 CT chest without contrast-calcified granulomas and mediastinal and hilar lymphadenopathy  Past Medical History:  Diagnosis Date   Allergy    GERD (gastroesophageal reflux disease)     Past Surgical History:  Procedure Laterality Date   COLONOSCOPY     ROTATOR CUFF REPAIR Left 2021    Allergies  Allergen Reactions   Mold Extract [Trichophyton] Cough    Social History   Socioeconomic History   Marital status: Married    Spouse name: Not on file   Number of children: Not on file   Years of education: Not on file   Highest education level: Not on file  Occupational History   Not on file  Tobacco Use    Smoking status: Former   Smokeless tobacco: Never   Tobacco comments:    over 40 years ago  Vaping Use   Vaping Use: Never used  Substance and Sexual Activity   Alcohol use: Yes    Comment: occasional   Drug use: Never   Sexual activity: Not on file  Other Topics Concern   Not on file  Social History Narrative   Not on file   Social Determinants of Health   Financial Resource Strain: Not on file  Food Insecurity: Not on file  Transportation Needs: Not on file  Physical Activity: Not on file  Stress: Not on file  Social Connections: Not on file  Intimate Partner Violence: Not on file    Family History  Problem Relation Age of Onset   Healthy Mother    Other Father    Healthy Sister    Healthy Sister    Healthy Sister    Healthy Brother    Colon cancer Neg Hx    Rectal cancer Neg Hx    Colon polyps Neg Hx    Stomach cancer Neg Hx    Esophageal cancer Neg Hx         Review of Systems Constitutional: negative for anorexia, fevers and sweats  Eyes: negative for irritation, redness and visual disturbance  Ears, nose,  mouth, throat, and face: negative for earaches, epistaxis, nasal congestion and sore throat  Respiratory: negative for cough, dyspnea on exertion,  and wheezing  Cardiovascular: negative for chest pain, dyspnea, lower extremity edema, orthopnea, palpitations and syncope  Gastrointestinal: negative for abdominal pain, constipation, diarrhea, melena, nausea and vomiting  Genitourinary:negative for dysuria, frequency and hematuria  Hematologic/lymphatic: negative for bleeding, easy bruising and lymphadenopathy  Musculoskeletal:negative for arthralgias, muscle weakness and stiff joints  Neurological: negative for coordination problems, gait problems, headaches and weakness  Endocrine: negative for diabetic symptoms including polydipsia, polyuria and weight loss     Objective:   Physical Exam  Gen. Pleasant, well-nourished, in no distress, normal  affect ENT - no pallor,icterus, no post nasal drip Neck: No JVD, no thyromegaly, no carotid bruits Lungs: no use of accessory muscles, no dullness to percussion, clear without rales or rhonchi  Cardiovascular: Rhythm regular, heart sounds  normal, no murmurs or gallops, no peripheral edema Abdomen: soft and non-tender, no hepatosplenomegaly, BS normal. Musculoskeletal: No deformities, no cyanosis or clubbing Neuro:  alert, non focal       Assessment & Plan:

## 2022-09-09 ENCOUNTER — Ambulatory Visit (HOSPITAL_COMMUNITY)
Admission: RE | Admit: 2022-09-09 | Discharge: 2022-09-09 | Disposition: A | Discharge status: Home / Self Care | Payer: Medicare HMO | Source: Ambulatory Visit | Attending: Pulmonary Disease | Admitting: Pulmonary Disease

## 2022-09-09 DIAGNOSIS — R053 Chronic cough: Secondary | ICD-10-CM | POA: Diagnosis present

## 2022-09-11 ENCOUNTER — Telehealth: Payer: Self-pay | Admitting: Pulmonary Disease

## 2022-09-11 NOTE — Telephone Encounter (Signed)
PT said someone called him. No tel encounter. He thinks it might be his CT scan results. Please call to advise. [phone]

## 2022-09-12 NOTE — Telephone Encounter (Signed)
Attempted to call patient but no answer. When he calls back give him these results on CT scan from Dr Elsworth Soho:  Calcified lymph nodes and nodules suggesting old healed infection, nothing active.  No findings to explain his cough

## 2022-09-22 NOTE — Telephone Encounter (Signed)
Called and spoke with pt letting him know the results of the CT and he verbalized understanding. Nothing further needed. 

## 2022-09-24 ENCOUNTER — Encounter (HOSPITAL_BASED_OUTPATIENT_CLINIC_OR_DEPARTMENT_OTHER): Payer: Self-pay | Admitting: Pulmonary Disease

## 2022-09-24 ENCOUNTER — Ambulatory Visit (INDEPENDENT_AMBULATORY_CARE_PROVIDER_SITE_OTHER): Payer: Medicare HMO | Admitting: Pulmonary Disease

## 2022-09-24 VITALS — BP 120/72 | HR 64 | Temp 97.8°F | Ht 69.0 in | Wt 199.6 lb

## 2022-09-24 DIAGNOSIS — R053 Chronic cough: Secondary | ICD-10-CM

## 2022-09-24 LAB — PULMONARY FUNCTION TEST
DL/VA % pred: 109 %
DL/VA: 4.49 ml/min/mmHg/L
DLCO cor % pred: 121 %
DLCO cor: 30.97 ml/min/mmHg
DLCO unc % pred: 121 %
DLCO unc: 30.97 ml/min/mmHg
FEF 25-75 Post: 2.68 L/sec
FEF 25-75 Pre: 2.65 L/sec
FEF2575-%Change-Post: 1 %
FEF2575-%Pred-Post: 108 %
FEF2575-%Pred-Pre: 106 %
FEV1-%Change-Post: 0 %
FEV1-%Pred-Post: 110 %
FEV1-%Pred-Pre: 110 %
FEV1-Post: 3.54 L
FEV1-Pre: 3.53 L
FEV1FVC-%Change-Post: 0 %
FEV1FVC-%Pred-Pre: 100 %
FEV6-%Change-Post: 0 %
FEV6-%Pred-Post: 114 %
FEV6-%Pred-Pre: 114 %
FEV6-Post: 4.7 L
FEV6-Pre: 4.67 L
FEV6FVC-%Change-Post: 0 %
FEV6FVC-%Pred-Post: 105 %
FEV6FVC-%Pred-Pre: 104 %
FVC-%Change-Post: 0 %
FVC-%Pred-Post: 109 %
FVC-%Pred-Pre: 109 %
FVC-Post: 4.72 L
FVC-Pre: 4.73 L
Post FEV1/FVC ratio: 75 %
Post FEV6/FVC ratio: 99 %
Pre FEV1/FVC ratio: 75 %
Pre FEV6/FVC Ratio: 99 %
RV % pred: 123 %
RV: 2.92 L
TLC % pred: 115 %
TLC: 7.84 L

## 2022-09-24 NOTE — Patient Instructions (Signed)
Full PFT Performed Today  

## 2022-09-24 NOTE — Patient Instructions (Signed)
CT scan & lung function is normal  Cough may be related to sinus drip & reflux

## 2022-09-24 NOTE — Progress Notes (Signed)
Full PFT Performed Today  

## 2022-09-24 NOTE — Assessment & Plan Note (Signed)
He feels at least 50% improved after treatment with antihistamine/decongestant combination.  This indicates that at least 50% of the cough is being treated by upper airway cough syndrome. I think the other trigger here is likely reflux. We discussed role of PPI and nonpharmacological measures including small meals, raising the bed up & longer duration between last meal and bedtime. He will intensify these measures today of a flareup of his cough.

## 2022-09-24 NOTE — Assessment & Plan Note (Signed)
Calcified mediastinal and hilar lymph nodes and calcified nodules indicates old healed infection. PFTs are normal which is reassuring

## 2022-09-24 NOTE — Progress Notes (Signed)
   Subjective:    Patient ID: David Hopkins, male    DOB: [birth year], 67 y.o.   MRN: [id number]  HPI  67 yo remote smoker for FU of cough ongoing for 8 to 10 years.  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 Pt said he has been doing okay since last visit. States he still has an occasional cough but is better than it was after taking medication at night as directed.   Initial office visit in February we gave him a combination of antihistaminic/decongestant. He was maintained on omeprazole for reflux. He feels more than 50% improved.  Still has cough on occasion but now he is at least able to go to a restaurant and have a sitdown meal. We reviewed CT scan and PFTs today. He continues on 40 mg of omeprazole daily and occasionally has breakthrough reflux    Significant tests/ events reviewed  09/2022 PFTs normal 08/2022 CT chest wo con >>Calcified mediastinal and hilar lymph nodes.Multiple calcified granulomas  02/2009 CT chest without contrast-calcified granulomas and mediastinal and hilar lymphadenopathy    Review of Systems neg for any significant sore throat, dysphagia, itching, sneezing, nasal congestion or excess/ purulent secretions, fever, chills, sweats, unintended wt loss, pleuritic or exertional cp, hempoptysis, orthopnea pnd or change in chronic leg swelling. Also denies presyncope, palpitations, heartburn, abdominal pain, nausea, vomiting, diarrhea or change in bowel or urinary habits, dysuria,hematuria, rash, arthralgias, visual complaints, headache, numbness weakness or ataxia.     Objective:   Physical Exam  Gen. Pleasant, well-nourished, in no distress ENT - no thrush, no pallor/icterus,no post nasal drip Neck: No JVD, no thyromegaly, no carotid bruits Lungs: no use of accessory muscles, no dullness to percussion, clear without  rales or rhonchi  Cardiovascular: Rhythm regular, heart sounds  normal, no murmurs or gallops, no peripheral edema Musculoskeletal: No deformities, no cyanosis or clubbing         Assessment & Plan:

## 2022-11-12 ENCOUNTER — Ambulatory Visit (INDEPENDENT_AMBULATORY_CARE_PROVIDER_SITE_OTHER): Payer: Medicare HMO

## 2022-11-12 ENCOUNTER — Ambulatory Visit (INDEPENDENT_AMBULATORY_CARE_PROVIDER_SITE_OTHER): Payer: Medicare HMO | Admitting: Podiatry

## 2022-11-12 ENCOUNTER — Encounter: Payer: Self-pay | Admitting: Podiatry

## 2022-11-12 ENCOUNTER — Ambulatory Visit: Payer: Medicare HMO

## 2022-11-12 DIAGNOSIS — M722 Plantar fascial fibromatosis: Secondary | ICD-10-CM | POA: Diagnosis not present

## 2022-11-12 DIAGNOSIS — M79671 Pain in right foot: Secondary | ICD-10-CM | POA: Diagnosis not present

## 2022-11-12 DIAGNOSIS — M79672 Pain in left foot: Secondary | ICD-10-CM

## 2022-11-12 MED ORDER — TRIAMCINOLONE ACETONIDE 10 MG/ML IJ SUSP
10.0000 mg | Freq: Once | INTRAMUSCULAR | Status: AC
  Administered 2022-11-12: 10 mg

## 2022-11-12 NOTE — Patient Instructions (Signed)

## 2022-11-12 NOTE — Progress Notes (Signed)
Subjective:   Patient ID: David Hopkins, male   DOB: 67 y.o.   MRN: [id number]   HPI Patient presents with 23-month history of severe left heel pain that has gotten worse over that time.  He tries to be very active and this is increasingly making it hard for him to do and patient's not walking with a normal gait pattern.  Patient does not smoke likes to be active   Review of Systems  All other systems reviewed and are negative.       Objective:  Physical Exam Vitals and nursing note reviewed.  Constitutional:      Appearance: He is well-developed.  Pulmonary:     Effort: Pulmonary effort is normal.  Musculoskeletal:        General: Normal range of motion.  Skin:    General: Skin is warm.  Neurological:     Mental Status: He is alert.     Neurovascular status intact muscle strength adequate range of motion within normal limits with exquisite discomfort noted of the medial fascial band left heel at the insertional point tendon calcaneus fluid buildup moderate depression of the arch noted.  Good digital perfusion well-oriented x 3     Assessment:  Acute Planter fasciitis left with history of excess ptotic procedure that did well for him with pain and flattening of the arch     Plan:  H&P x-ray reviewed condition discussed great length and I went ahead today did sterile prep injected the medial band of the fascia 3 mg Kenalog 5 mg Xylocaine dispensed instructions on stretching shoe gear modifications ice and may require orthotics.  Reappoint to recheck again in the next several weeks  X-rays indicate depression of the arch spur formation no indication stress fracture arthritis

## 2022-11-13 ENCOUNTER — Other Ambulatory Visit: Payer: Self-pay | Admitting: Podiatry

## 2022-11-13 DIAGNOSIS — M722 Plantar fascial fibromatosis: Secondary | ICD-10-CM

## 2022-11-13 DIAGNOSIS — M79671 Pain in right foot: Secondary | ICD-10-CM

## 2022-11-13 DIAGNOSIS — M79672 Pain in left foot: Secondary | ICD-10-CM

## 2022-11-26 ENCOUNTER — Ambulatory Visit (INDEPENDENT_AMBULATORY_CARE_PROVIDER_SITE_OTHER): Payer: Medicare HMO | Admitting: Podiatry

## 2022-11-26 ENCOUNTER — Encounter: Payer: Self-pay | Admitting: Podiatry

## 2022-11-26 DIAGNOSIS — M722 Plantar fascial fibromatosis: Secondary | ICD-10-CM | POA: Diagnosis not present

## 2022-11-26 MED ORDER — TRIAMCINOLONE ACETONIDE 10 MG/ML IJ SUSP
10.0000 mg | Freq: Once | INTRAMUSCULAR | Status: AC
  Administered 2022-11-26: 10 mg

## 2022-11-26 NOTE — Progress Notes (Signed)
Subjective:   Patient ID: David Hopkins, male   DOB: 67 y.o.   MRN: [id number]   HPI Patient states still having a lot of pain in the left heel especially after periods of activity and sitting and that he is trying to stretch it but not doing a great job   ROS      Objective:  Physical Exam  Neurovascular status intact with patient found to have exquisite discomfort left heel at the insertional point tendon calcaneus fluid buildup with tight plantar fascia     Assessment:  Acute Planter fasciitis left with inflammation fluid     Plan:  H&P reviewed and at this point I went ahead and I reinjected the plantar fascia 3 mg Kenalog 5 mg Xylocaine and applied night splint with all instructions on usage properly fitted to the lower leg with a prefab static device that is off-the-shelf and will provide for complete stretching of the plantar fascia.  Also dispensed ice packs with all instructions on usage while stretching
